# Patient Record
Sex: Male | Born: 1985 | Race: White | Hispanic: No | Marital: Single | Smoking: Current every day smoker
Health system: Southern US, Community
[De-identification: ages and names within clinical notes are randomized; demographics above are authoritative.]

---

## 2004-08-30 ENCOUNTER — Emergency Department (HOSPITAL_COMMUNITY): Admission: EM | Admit: 2004-08-30 | Discharge: 2004-08-30 | Payer: Self-pay | Admitting: Emergency Medicine

## 2006-12-23 ENCOUNTER — Emergency Department (HOSPITAL_COMMUNITY): Admission: EM | Admit: 2006-12-23 | Discharge: 2006-12-23 | Payer: Self-pay | Admitting: Emergency Medicine

## 2007-07-05 ENCOUNTER — Emergency Department (HOSPITAL_COMMUNITY): Admission: EM | Admit: 2007-07-05 | Discharge: 2007-07-05 | Payer: Self-pay | Admitting: Emergency Medicine

## 2007-07-23 ENCOUNTER — Emergency Department (HOSPITAL_COMMUNITY): Admission: EM | Admit: 2007-07-23 | Discharge: 2007-07-23 | Payer: Self-pay | Admitting: Emergency Medicine

## 2009-11-26 ENCOUNTER — Emergency Department (HOSPITAL_COMMUNITY): Admission: EM | Admit: 2009-11-26 | Discharge: 2009-11-26 | Payer: Self-pay | Admitting: Emergency Medicine

## 2010-04-12 ENCOUNTER — Emergency Department (HOSPITAL_COMMUNITY): Admission: EM | Admit: 2010-04-12 | Discharge: 2010-04-12 | Payer: Self-pay | Admitting: Emergency Medicine

## 2010-05-29 ENCOUNTER — Emergency Department (HOSPITAL_COMMUNITY): Admission: EM | Admit: 2010-05-29 | Discharge: 2010-05-30 | Payer: Self-pay | Admitting: Emergency Medicine

## 2010-07-01 ENCOUNTER — Emergency Department (HOSPITAL_COMMUNITY): Admission: EM | Admit: 2010-07-01 | Discharge: 2010-07-01 | Payer: Self-pay | Admitting: Emergency Medicine

## 2011-05-21 LAB — MONONUCLEOSIS SCREEN: Mono Screen: NEGATIVE

## 2012-05-28 IMAGING — CR DG CHEST 2V
2 series · 2 of 2 positions shown · non-contrast
Comparison: None.

CLINICAL DATA: Cough and congestion for 1 month; history of smoking
and childhood asthma.

CHEST - 2 VIEW

[view not recorded (1 of 2)]
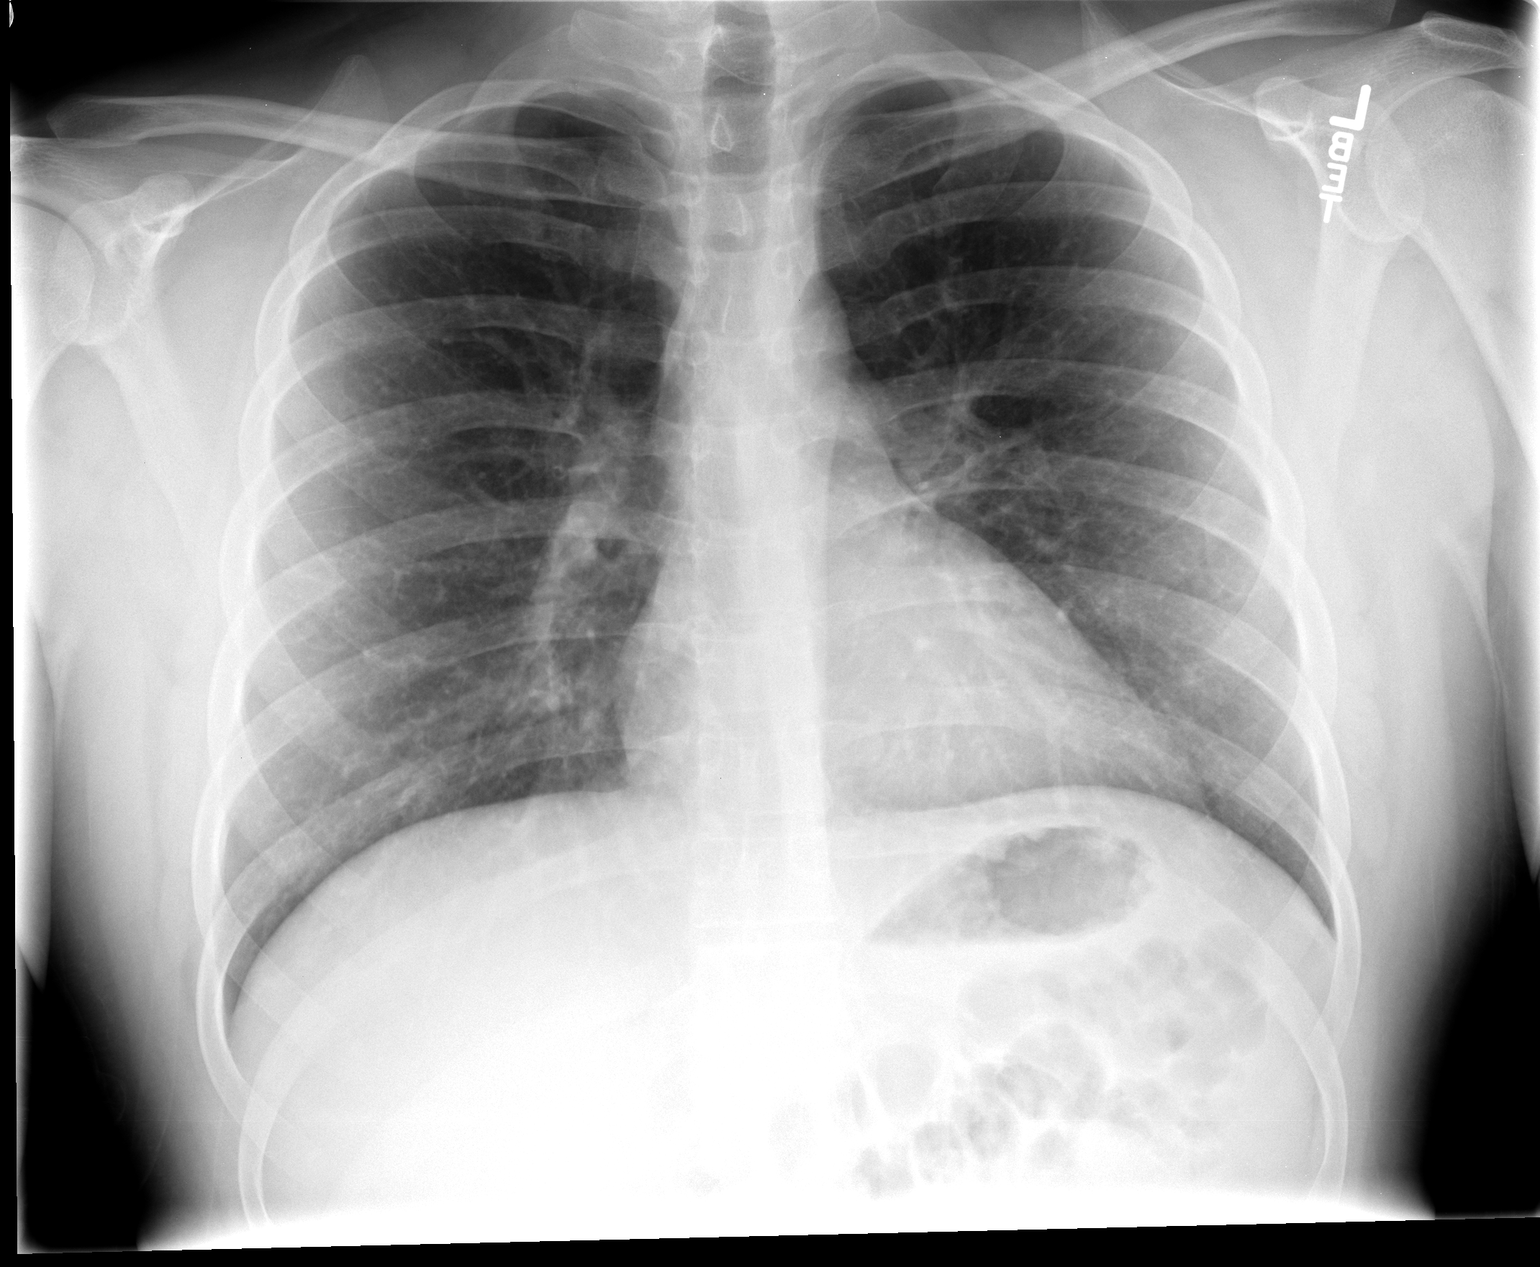

[view not recorded (2 of 2)]
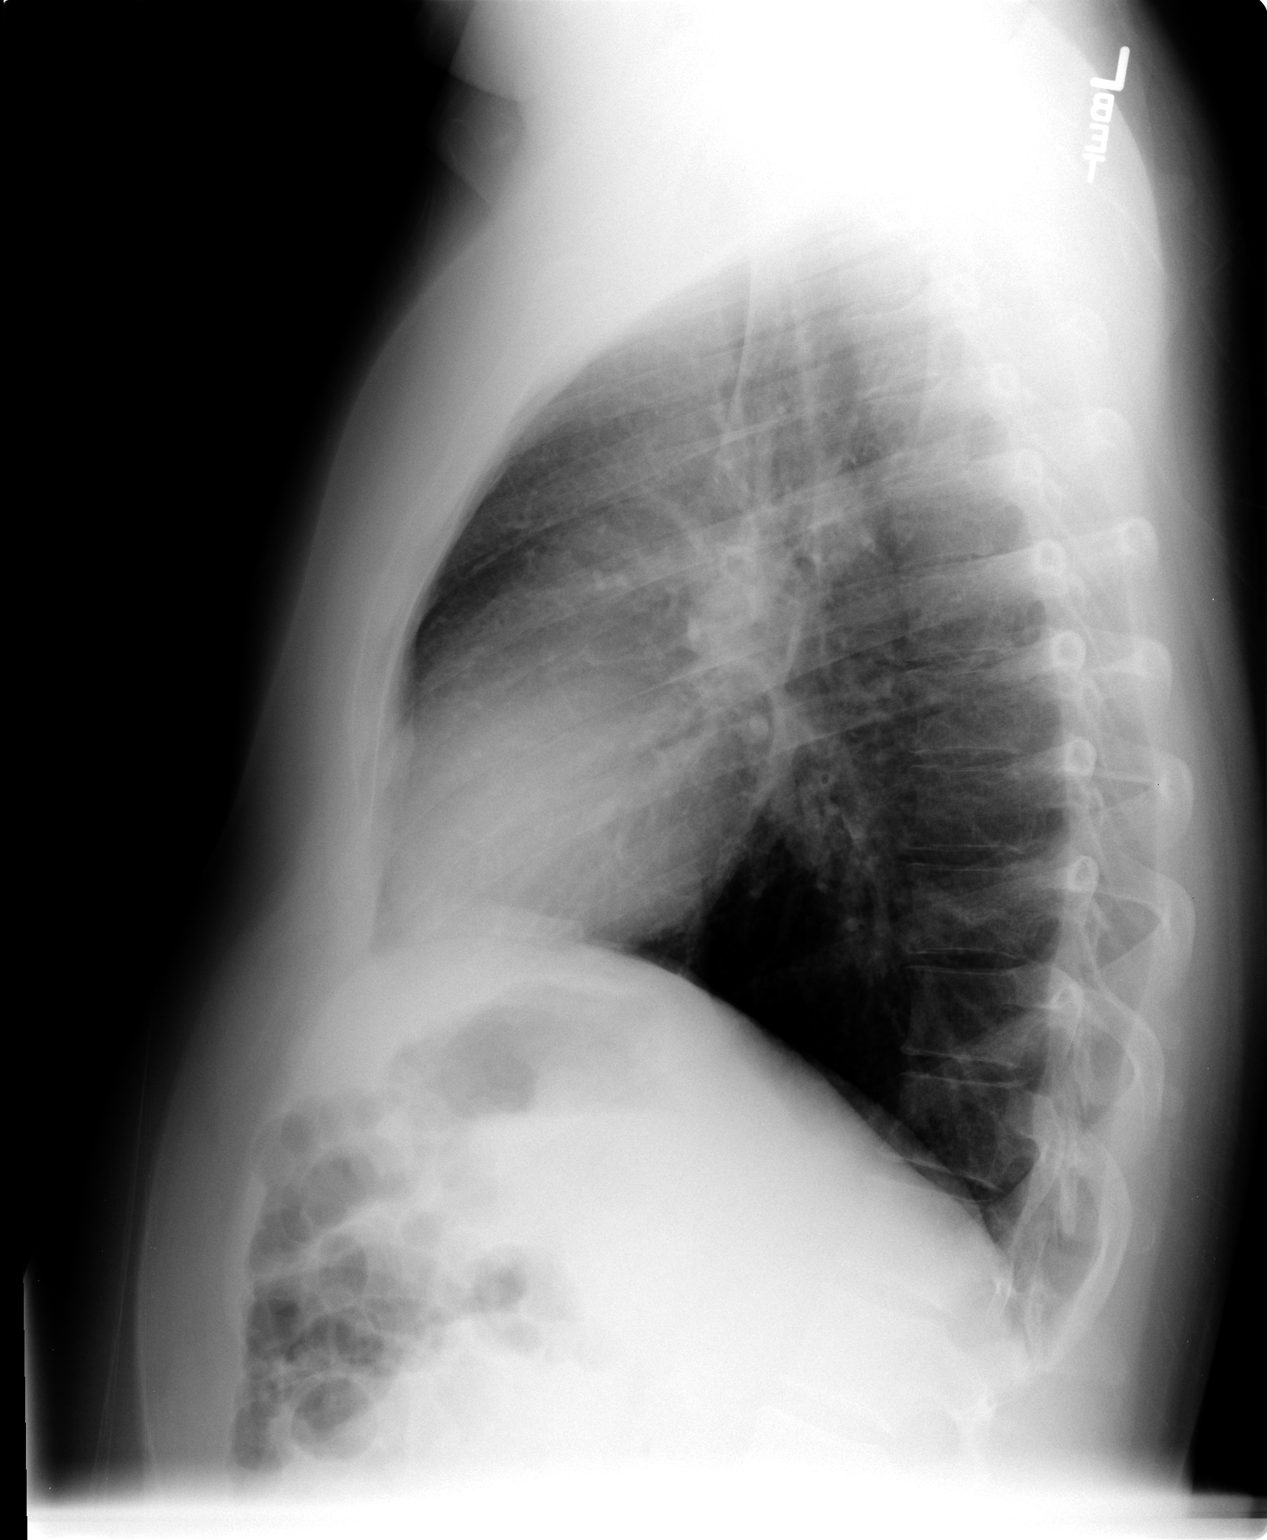

[2 of 2 positions shown; findings below may reference images not displayed]

FINDINGS: The lungs are well-aerated; peribronchial thickening may
reflect the patient's history of asthma or smoking.  There is no
evidence of focal opacification, pleural effusion or pneumothorax.

The heart is normal in size; the mediastinal contour is within
normal limits.  No acute osseous abnormalities are seen.
IMPRESSION: Peribronchial thickening may reflect the patient's history of
asthma or smoking.  Lungs otherwise clear.

## 2012-06-30 IMAGING — CR DG HAND COMPLETE 3+V*L*
3 series · 3 of 3 positions shown · non-contrast
Comparison: Three views of the hand 11/26/2009.

CLINICAL DATA: Trauma to there is on left hand.  Pain over the
uncles of the second, third, fourth, and fifth MCP joints.

LEFT HAND - COMPLETE 3+ VIEW

[view not recorded (1 of 3)]
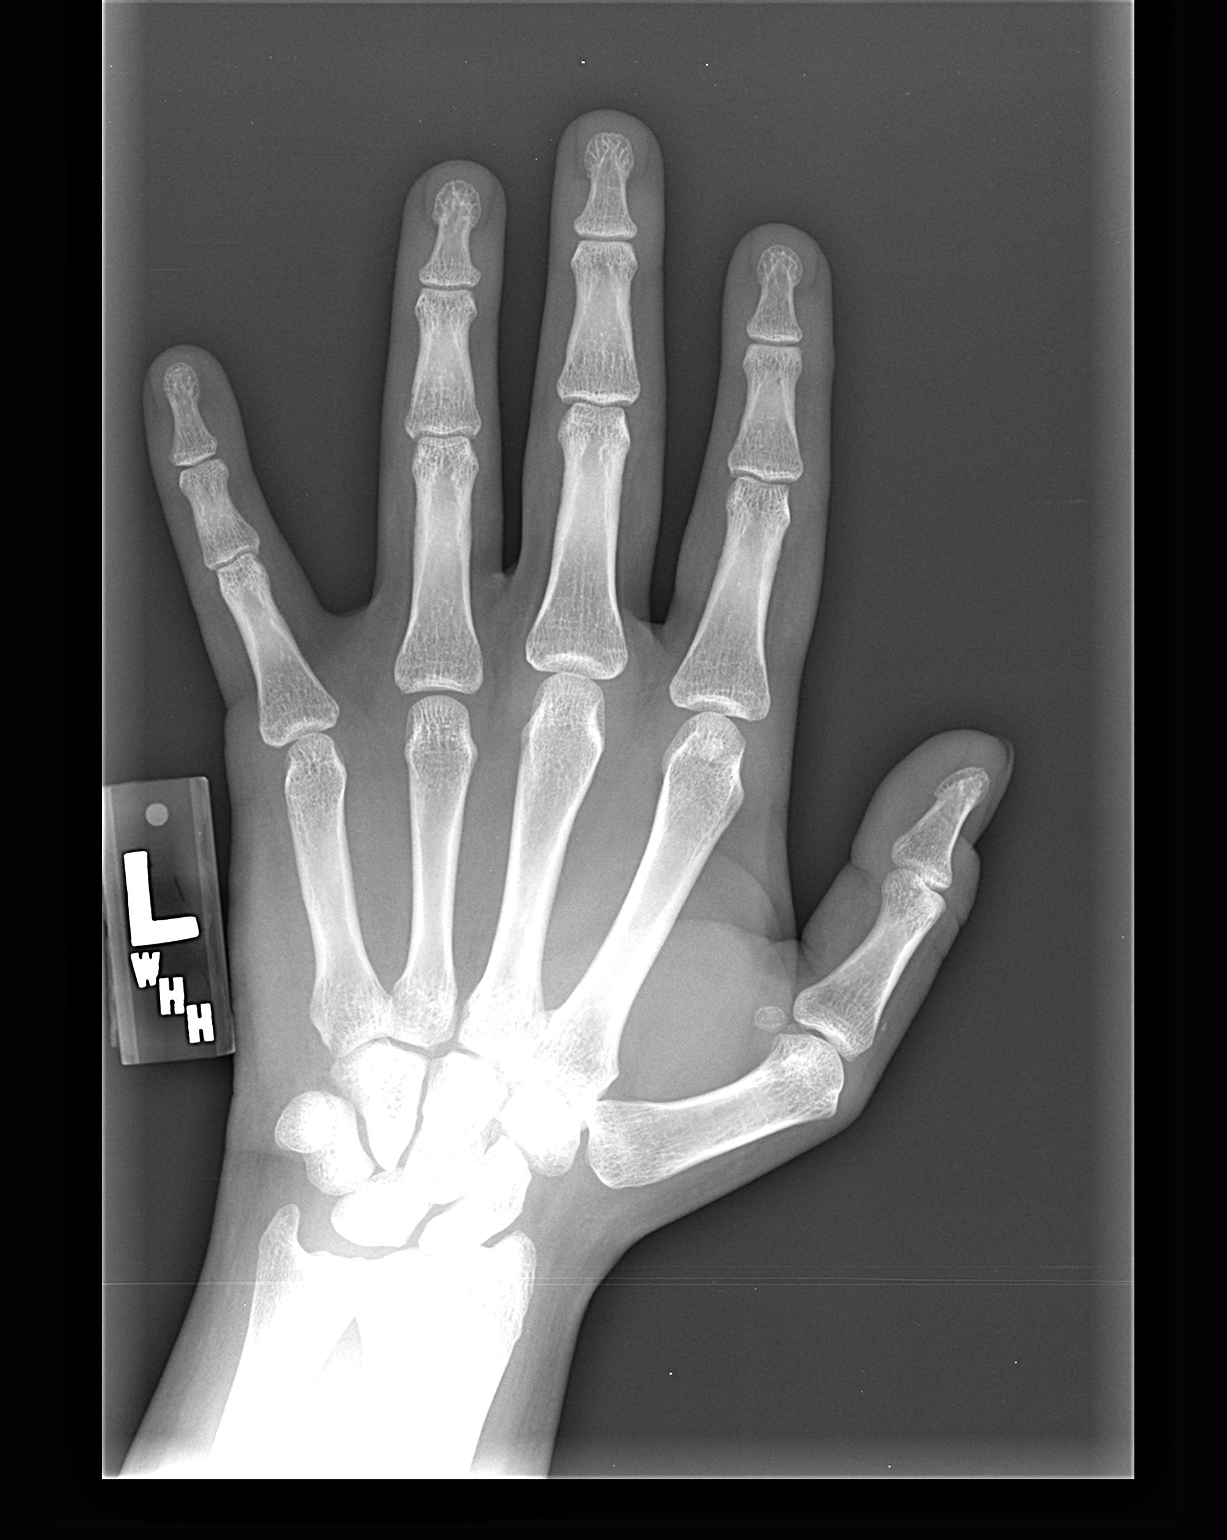

[view not recorded (2 of 3)]
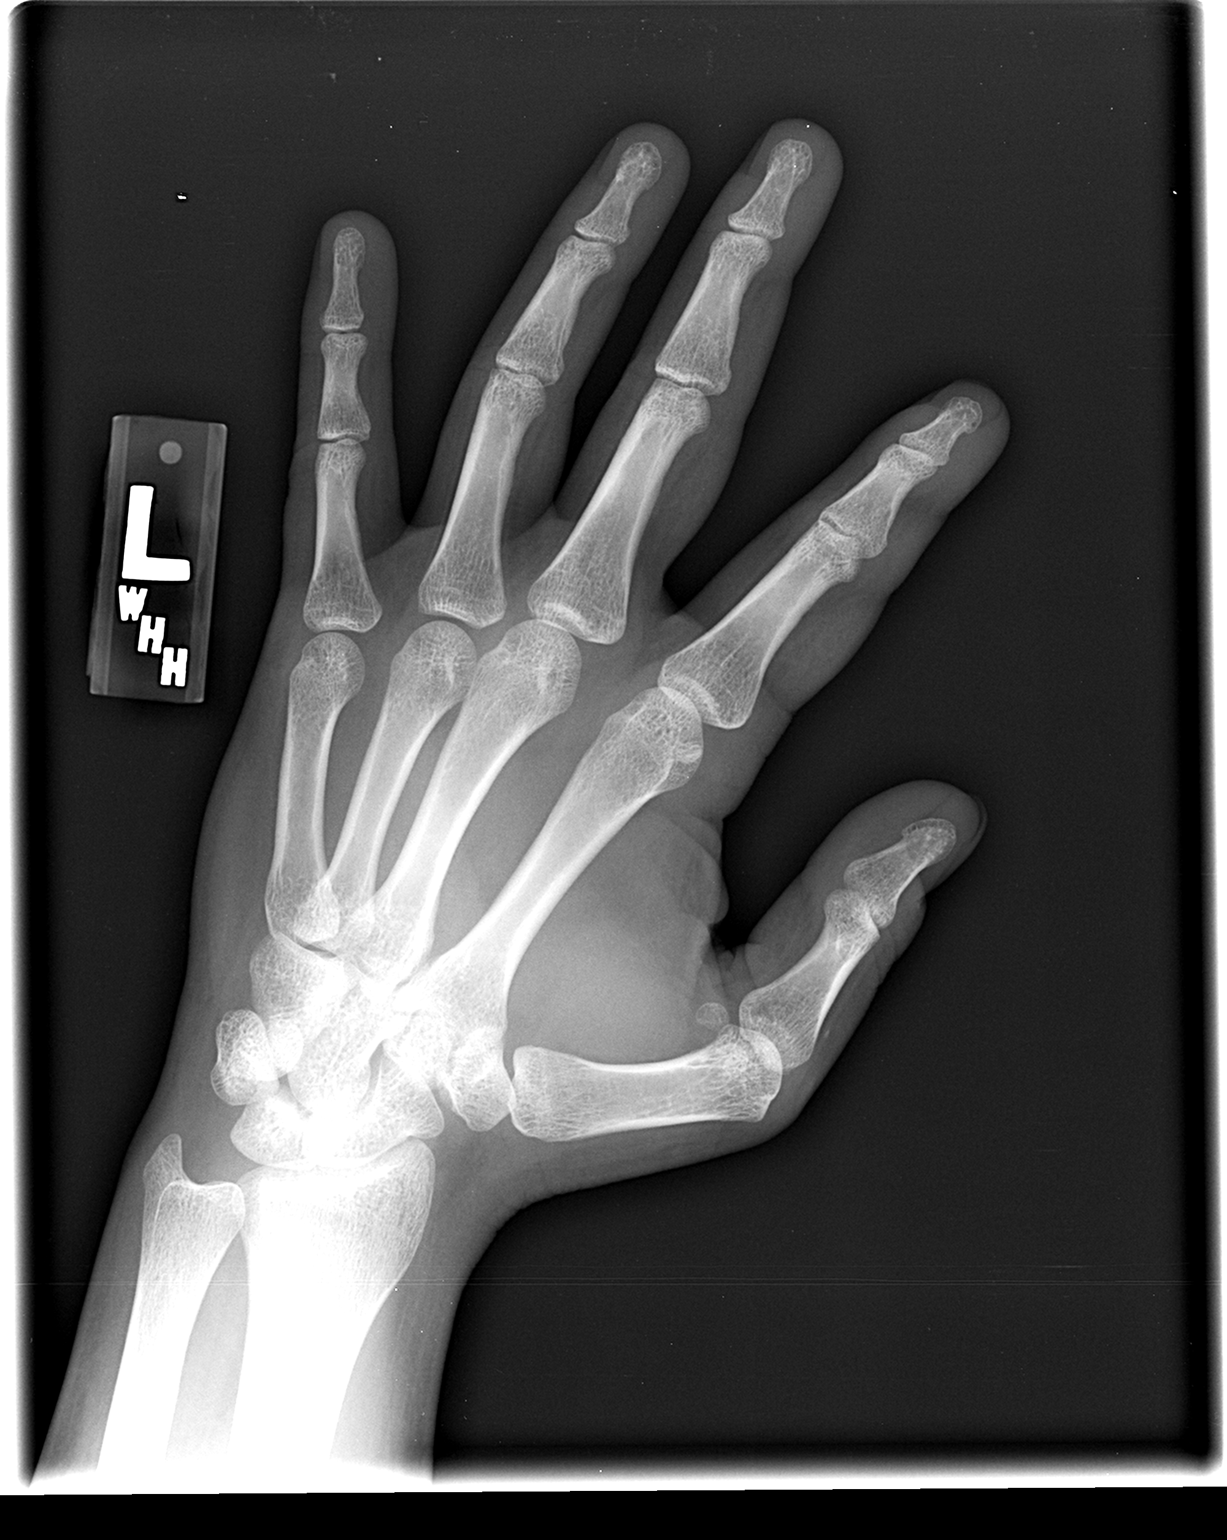

[view not recorded (3 of 3)]
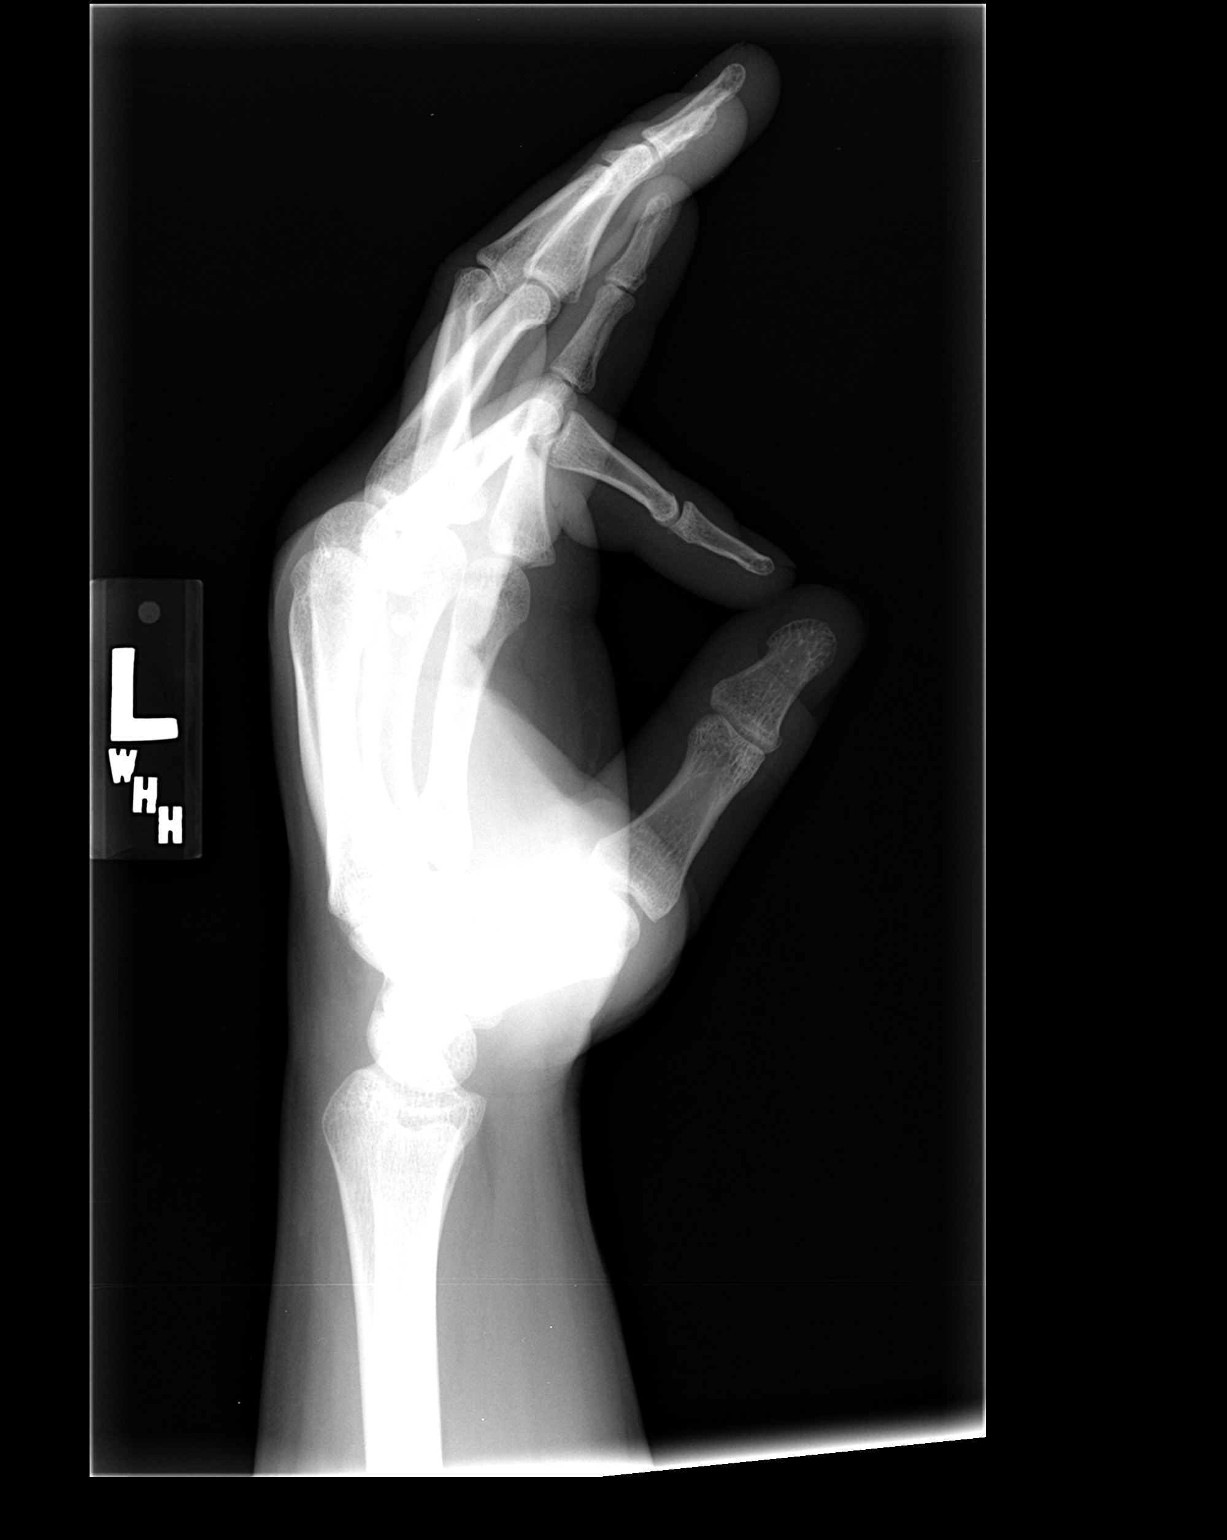

[3 of 3 positions shown; findings below may reference images not displayed]

FINDINGS: Mild soft tissue swelling is noted over the MCP joints.
No acute fracture or traumatic subluxation is evident.
IMPRESSION: 1.  Minimal dorsal soft tissue swelling.
2.  No underlying fracture or dislocation.

## 2018-01-13 ENCOUNTER — Emergency Department (HOSPITAL_COMMUNITY)
Admission: EM | Admit: 2018-01-13 | Discharge: 2018-01-13 | Disposition: A | Discharge status: Home / Self Care | Payer: Self-pay | Attending: Emergency Medicine | Admitting: Emergency Medicine

## 2018-01-13 ENCOUNTER — Encounter (HOSPITAL_COMMUNITY): Payer: Self-pay | Admitting: Emergency Medicine

## 2018-01-13 ENCOUNTER — Other Ambulatory Visit: Payer: Self-pay

## 2018-01-13 DIAGNOSIS — J0100 Acute maxillary sinusitis, unspecified: Secondary | ICD-10-CM | POA: Insufficient documentation

## 2018-01-13 DIAGNOSIS — H9201 Otalgia, right ear: Secondary | ICD-10-CM

## 2018-01-13 DIAGNOSIS — H6121 Impacted cerumen, right ear: Secondary | ICD-10-CM | POA: Insufficient documentation

## 2018-01-13 DIAGNOSIS — F17210 Nicotine dependence, cigarettes, uncomplicated: Secondary | ICD-10-CM | POA: Insufficient documentation

## 2018-01-13 MED ORDER — ACETAMINOPHEN 500 MG PO TABS
1000.0000 mg | ORAL_TABLET | Freq: Once | ORAL | Status: AC
  Administered 2018-01-13: 1000 mg via ORAL
  Filled 2018-01-13: qty 2

## 2018-01-13 MED ORDER — HYDROGEN PEROXIDE 3 % EX SOLN
Freq: Once | CUTANEOUS | Status: AC
  Administered 2018-01-13: 16:00:00 via TOPICAL
  Filled 2018-01-13: qty 473

## 2018-01-13 MED ORDER — AMOXICILLIN 500 MG PO CAPS: 500.0000 mg | ORAL_CAPSULE | Freq: Three times a day (TID) | ORAL | 0 refills | Status: AC

## 2018-01-13 MED ORDER — HYDROCODONE-ACETAMINOPHEN 5-325 MG PO TABS
1.0000 | ORAL_TABLET | Freq: Once | ORAL | Status: AC
  Administered 2018-01-13: 1 via ORAL
  Filled 2018-01-13: qty 1

## 2018-01-13 MED ORDER — HYDROCODONE-ACETAMINOPHEN 5-325 MG PO TABS: 1.0000 | ORAL_TABLET | ORAL | 0 refills | Status: DC | PRN

## 2018-01-13 NOTE — ED Provider Notes (Signed)
[facility] EMERGENCY DEPARTMENT Provider Note   CSN: [id number] Arrival date & time: 01/13/18  1504     History   Chief Complaint Chief Complaint  Patient presents with  . Otalgia    HPI Everrett CoombeJason L Chirico is a 32 y.o. male with no significant past medical history presenting with right ear pain and muffled hearing since he woke this am. He reports radiating pain around the ear involving his temple area.  There has been no drainage from the ear, he denies objective fever, but has felt achy and "flulike" for the past several days.  He also endorses chronic nasal congestion with clear rhinorrhea.  He has a history of dental problems, but no current dental pain or gingival swelling.  He has had no medications or treatments prior to arrival for today's complaint.  The history is provided by the patient.    History reviewed. No pertinent past medical history.  There are no active problems to display for this patient.   History reviewed. No pertinent surgical history.      Home Medications    Prior to Admission medications   Medication Sig Start Date End Date Taking? Authorizing Provider  amoxicillin (AMOXIL) 500 MG capsule Take 1 capsule (500 mg total) by mouth 3 (three) times daily for 10 days. 01/13/18 01/23/18  Burgess AmorIdol, Erion Weightman, PA-C  HYDROcodone-acetaminophen (NORCO/VICODIN) 5-325 MG tablet Take 1 tablet by mouth every 4 (four) hours as needed. 01/13/18   Burgess AmorIdol, Amariyon Maynes, PA-C    Family History History reviewed. No pertinent family history.  Social History Social History   Tobacco Use  . Smoking status: Current Every Day Smoker    Packs/day: 1.00    Types: Cigarettes  . Smokeless tobacco: Never Used  Substance Use Topics  . Alcohol use: Not Currently    Frequency: Never  . Drug use: Not Currently     Allergies   Motrin [ibuprofen] and Tramadol   Review of Systems Review of Systems  Constitutional: Negative for chills and fever.  HENT: Positive for congestion, ear pain,  hearing loss and rhinorrhea. Negative for ear discharge, facial swelling, nosebleeds, sinus pressure, sore throat, trouble swallowing and voice change.   Eyes: Negative for discharge.  Respiratory: Negative for cough, shortness of breath, wheezing and stridor.   Cardiovascular: Negative for chest pain.  Gastrointestinal: Negative for abdominal pain.  Genitourinary: Negative.   Musculoskeletal: Positive for myalgias.     Physical Exam Updated Vital Signs BP (!) 124/91   Pulse 97   Temp 98.5 F (36.9 C) (Oral)   Resp 15   Ht 5\' 5"  (1.651 m)   Wt 97.5 kg (215 lb)   SpO2 97%   BMI 35.78 kg/m   Physical Exam  Constitutional: He is oriented to person, place, and time. He appears well-developed and well-nourished.  HENT:  Head: Normocephalic and atraumatic.  Right Ear: Ear canal normal. No mastoid tenderness.  Left Ear: Tympanic membrane and ear canal normal.  Nose: Mucosal edema and rhinorrhea present. Right sinus exhibits maxillary sinus tenderness. Right sinus exhibits no frontal sinus tenderness. Left sinus exhibits no maxillary sinus tenderness and no frontal sinus tenderness.  Mouth/Throat: Uvula is midline, oropharynx is clear and moist and mucous membranes are normal. No oropharyngeal exudate, posterior oropharyngeal edema, posterior oropharyngeal erythema or tonsillar abscesses.  Right cerumen impaction.  Eyes: Conjunctivae are normal.  Cardiovascular: Normal rate and normal heart sounds.  Pulmonary/Chest: Effort normal. No respiratory distress. He has no wheezes.  Musculoskeletal: Normal range of motion.  Neurological: He is alert and oriented to person, place, and time.  Skin: Skin is warm and dry. No rash noted.  Psychiatric: He has a normal mood and affect.     ED Treatments / Results  Labs (all labs ordered are listed, but only abnormal results are displayed) Labs Reviewed - No data to display  EKG None  Radiology No results found.  Procedures .Ear Cerumen  Removal Date/Time: 01/13/2018 4:24 PM Performed by: Burgess Amor, PA-C Authorized by: Burgess Amor, PA-C   Consent:    Consent obtained:  Verbal   Consent given by:  Patient   Risks discussed:  Pain, TM perforation, dizziness, incomplete removal and bleeding   Alternatives discussed:  No treatment Procedure details:    Location:  R ear   Procedure type: irrigation   Post-procedure details:    Inspection:  TM intact   Hearing quality:  Improved   Patient tolerance of procedure:  Tolerated well, no immediate complications Comments:     Cerumen removed with residual debris but able to visualize TM with no erythema, bulging or effusion.   (including critical care time)  Medications Ordered in ED Medications  acetaminophen (TYLENOL) tablet 1,000 mg (1,000 mg Oral Given 01/13/18 1621)  hydrogen peroxide 3 % external solution ( Topical Given 01/13/18 1629)  HYDROcodone-acetaminophen (NORCO/VICODIN) 5-325 MG per tablet 1 tablet (1 tablet Oral Given 01/13/18 1727)     Initial Impression / Assessment and Plan / ED Course  I have reviewed the triage vital signs and the nursing notes.  Pertinent labs & imaging results that were available during my care of the patient were reviewed by me and considered in my medical decision making (see chart for details).     Pt with right cerumen impaction and right maxillary sinusitis.  He was placed on amoxil, small amount of hydrocodone for pain relief prescribed after [place] narc database reviewed. Wife endorses he keeps nasal congestion constantly, he concurs, stating cannot breath well except through mouth chronically.  Pt may benefit from ENT eval for possible nasal polyp/adenoids, etc.  Referral given.   Final Clinical Impressions(s) / ED Diagnoses   Final diagnoses:  Impacted cerumen of right ear  Otalgia of right ear  Acute maxillary sinusitis, recurrence not specified    ED Discharge Orders        Ordered    amoxicillin (AMOXIL) 500 MG capsule  3  times daily     01/13/18 1713    HYDROcodone-acetaminophen (NORCO/VICODIN) 5-325 MG tablet  Every 4 hours PRN     01/13/18 1713       Burgess Amor, PA-C 01/14/18 0141    Mesner, Barbara Cower, MD 01/19/18 [phone]

## 2018-01-13 NOTE — ED Triage Notes (Signed)
Pt states earache since this am.

## 2018-01-13 NOTE — Discharge Instructions (Addendum)
Take your entire course of the antibiotic prescribed. You may take the hydrocodone prescribed for pain relief.  This will make you drowsy - do not drive within 4 hours of taking this medication.

## 2018-11-06 ENCOUNTER — Encounter (HOSPITAL_COMMUNITY): Payer: Self-pay | Admitting: *Deleted

## 2018-11-06 ENCOUNTER — Other Ambulatory Visit: Payer: Self-pay

## 2018-11-06 ENCOUNTER — Emergency Department (HOSPITAL_COMMUNITY)
Admission: EM | Admit: 2018-11-06 | Discharge: 2018-11-06 | Disposition: A | Discharge status: Home / Self Care | Payer: Self-pay | Attending: Emergency Medicine | Admitting: Emergency Medicine

## 2018-11-06 DIAGNOSIS — F17210 Nicotine dependence, cigarettes, uncomplicated: Secondary | ICD-10-CM | POA: Insufficient documentation

## 2018-11-06 DIAGNOSIS — K0889 Other specified disorders of teeth and supporting structures: Secondary | ICD-10-CM | POA: Insufficient documentation

## 2018-11-06 DIAGNOSIS — Z79899 Other long term (current) drug therapy: Secondary | ICD-10-CM | POA: Insufficient documentation

## 2018-11-06 MED ORDER — HYDROCODONE-ACETAMINOPHEN 5-325 MG PO TABS: 2.0000 | ORAL_TABLET | ORAL | 0 refills | Status: AC | PRN

## 2018-11-06 MED ORDER — AMOXICILLIN 500 MG PO CAPS: 500.0000 mg | ORAL_CAPSULE | Freq: Three times a day (TID) | ORAL | 0 refills | Status: AC

## 2018-11-06 MED ORDER — BUPIVACAINE HCL (PF) 0.5 % IJ SOLN
10.0000 mL | Freq: Once | INTRAMUSCULAR | Status: AC
  Administered 2018-11-06: 10 mL
  Filled 2018-11-06: qty 30

## 2018-11-06 NOTE — Discharge Instructions (Addendum)
Return if any problems.

## 2018-11-06 NOTE — ED Provider Notes (Signed)
[facility] EMERGENCY DEPARTMENT Provider Note   CSN: [id number] Arrival date & time: 11/06/18  1902    History   Chief Complaint Chief Complaint  Patient presents with  . Dental Pain    HPI Edward Booker is a 33 y.o. male.     The history is provided by the patient. No language interpreter was used.  Dental Pain  Location:  Lower Quality:  Aching Severity:  Moderate Onset quality:  Gradual Timing:  Constant Progression:  Worsening Chronicity:  Recurrent Context: dental caries   Relieved by:  Nothing Worsened by:  Nothing Ineffective treatments:  Acetaminophen   History reviewed. No pertinent past medical history.  There are no active problems to display for this patient.   History reviewed. No pertinent surgical history.      Home Medications    Prior to Admission medications   Medication Sig Start Date End Date Taking? Authorizing Provider  amoxicillin (AMOXIL) 500 MG capsule Take 1 capsule (500 mg total) by mouth 3 (three) times daily. 11/06/18   Elson Areas, PA-C  HYDROcodone-acetaminophen (NORCO/VICODIN) 5-325 MG tablet Take 2 tablets by mouth every 4 (four) hours as needed. 11/06/18   Elson Areas, PA-C    Family History No family history on file.  Social History Social History   Tobacco Use  . Smoking status: Current Every Day Smoker    Packs/day: 1.00    Types: Cigarettes  . Smokeless tobacco: Never Used  Substance Use Topics  . Alcohol use: Not Currently    Frequency: Never  . Drug use: Not Currently     Allergies   Motrin [ibuprofen] and Tramadol   Review of Systems Review of Systems  All other systems reviewed and are negative.    Physical Exam Updated Vital Signs BP (!) 148/93 (BP Location: Right Arm)   Pulse (!) 113   Temp 97.8 F (36.6 C) (Oral)   Resp 18   SpO2 100%   Physical Exam Vitals signs reviewed.  HENT:     Head: Normocephalic.     Nose: Nose normal.     Mouth/Throat:     Comments: Poor  dentition,  Broken decayed teeth.   Cardiovascular:     Rate and Rhythm: Normal rate.  Pulmonary:     Effort: Pulmonary effort is normal.  Skin:    General: Skin is warm.  Neurological:     General: No focal deficit present.     Mental Status: He is alert.  Psychiatric:        Mood and Affect: Mood normal.      ED Treatments / Results  Labs (all labs ordered are listed, but only abnormal results are displayed) Labs Reviewed - No data to display  EKG None  Radiology No results found.  Procedures Procedures (including critical care time)  Medications Ordered in ED Medications  bupivacaine (MARCAINE) 0.5 % injection 10 mL (10 mLs Infiltration Given 11/06/18 2008)     Initial Impression / Assessment and Plan / ED Course  I have reviewed the triage vital signs and the nursing notes.  Pertinent labs & imaging results that were available during my care of the patient were reviewed by me and considered in my medical decision making (see chart for details).        Database reviewed.  Pt given 10 tablets hydrocodone.  Pt reports dentist can not see him until May.  I will give pt amoxicillian and hydrocodone.   Final Clinical Impressions(s) / ED Diagnoses  Final diagnoses:  Pain, dental    ED Discharge Orders         Ordered    amoxicillin (AMOXIL) 500 MG capsule  3 times daily     11/06/18 2018    HYDROcodone-acetaminophen (NORCO/VICODIN) 5-325 MG tablet  Every 4 hours PRN     11/06/18 2018        An After Visit Summary was printed and given to the patient.    Osie Cheeks 11/06/18 2022    Vanetta Mulders, MD 11/18/18 [phone]

## 2018-11-06 NOTE — ED Triage Notes (Signed)
Pt c/o right side lower dental pain that started a week ago, had appointment scheduled on 10/27/2018 and appointment was canceled.

## 2023-07-02 ENCOUNTER — Ambulatory Visit: Payer: Self-pay | Admitting: Family Medicine

## 2024-07-27 ENCOUNTER — Ambulatory Visit: Admitting: Diagnostic Neuroimaging

## 2024-07-27 ENCOUNTER — Encounter: Payer: Self-pay | Admitting: Diagnostic Neuroimaging

## 2024-08-02 ENCOUNTER — Ambulatory Visit: Payer: Self-pay | Admitting: Physician Assistant

## 2024-10-19 ENCOUNTER — Ambulatory Visit
